# Patient Record
Sex: Female | Born: 2000 | Hispanic: Yes | Marital: Single | Smoking: Never smoker
Health system: Southern US, Community
[De-identification: ages and names within clinical notes are randomized; demographics above are authoritative.]

## PROBLEM LIST (undated history)

## (undated) DIAGNOSIS — O139 Gestational [pregnancy-induced] hypertension without significant proteinuria, unspecified trimester: Secondary | ICD-10-CM

## (undated) DIAGNOSIS — Z789 Other specified health status: Secondary | ICD-10-CM

## (undated) HISTORY — PX: TONSILLECTOMY: SUR1361

---
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
## 2021-01-14 ENCOUNTER — Other Ambulatory Visit: Payer: Self-pay

## 2021-01-14 ENCOUNTER — Encounter: Payer: Self-pay | Admitting: Women's Health

## 2021-01-16 ENCOUNTER — Other Ambulatory Visit: Payer: Self-pay

## 2021-01-16 ENCOUNTER — Encounter: Payer: Self-pay | Admitting: Obstetrics & Gynecology

## 2021-01-16 ENCOUNTER — Ambulatory Visit (INDEPENDENT_AMBULATORY_CARE_PROVIDER_SITE_OTHER): Payer: Self-pay | Admitting: Obstetrics & Gynecology

## 2021-01-16 VITALS — BP 115/79 | HR 75 | Wt 170.0 lb

## 2021-01-16 DIAGNOSIS — Z3403 Encounter for supervision of normal first pregnancy, third trimester: Secondary | ICD-10-CM

## 2021-01-16 DIAGNOSIS — Z3A28 28 weeks gestation of pregnancy: Secondary | ICD-10-CM

## 2021-01-16 NOTE — Progress Notes (Signed)
° °  LOW-RISK PREGNANCY VISIT Patient name: Ruth Wood MRN [id number]  Date of birth: 2001-01-16 Chief Complaint:   Routine Prenatal Visit  History of Present Illness:   Ruth Wood is a 20 y.o. G84P0010 female at [redacted]w[redacted]d with an Estimated Date of Delivery: 04/10/21 being seen today for ongoing management of a low-risk pregnancy.  Depression screen [facility] 2/9 12/17/2020 09/19/2019  Decreased Interest 0 1  Down, Depressed, Hopeless 0 0  PHQ - 2 Score 0 1  Altered sleeping 0 1  Tired, decreased energy 0 0  Change in appetite 0 0  Feeling bad or failure about yourself  0 0  Trouble concentrating 1 0  Moving slowly or fidgety/restless 0 2  Suicidal thoughts 0 0  PHQ-9 Score 1 4    Today she reports no complaints. Contractions: Not present. Vag. Bleeding: None.  Movement: Present. denies leaking of fluid. Review of Systems:   Pertinent items are noted in HPI Denies abnormal vaginal discharge w/ itching/odor/irritation, headaches, visual changes, shortness of breath, chest pain, abdominal pain, severe nausea/vomiting, or problems with urination or bowel movements unless otherwise stated above. Pertinent History Reviewed:  Reviewed past medical,surgical, social, obstetrical and family history.  Reviewed problem list, medications and allergies. Physical Assessment:   Vitals:   01/16/21 0902  BP: 115/79  Pulse: 75  Weight: 170 lb (77.1 kg)  Body mass index is 30.11 kg/m.        Physical Examination:   General appearance: Well appearing, and in no distress  Mental status: Alert, oriented to person, place, and time  Skin: Warm & dry  Cardiovascular: Normal heart rate noted  Respiratory: Normal respiratory effort, no distress  Abdomen: Soft, gravid, nontender  Pelvic: Cervical exam deferred         Extremities: Edema: None  Fetal Status: Fetal Heart Rate (bpm): 153 Fundal Height: 28 cm Movement: Present    Chaperone: n/a    No results found for this or any previous  visit (from the past 24 hour(s)).  Assessment & Plan:  1) Low-risk pregnancy G2P0010 at [redacted]w[redacted]d with an Estimated Date of Delivery: 04/10/21      Meds: No orders of the defined types were placed in this encounter.  Labs/procedures today: PN2  Plan:  Continue routine obstetrical care  Next visit: prefers in person    Reviewed: Preterm labor symptoms and general obstetric precautions including but not limited to vaginal bleeding, contractions, leaking of fluid and fetal movement were reviewed in detail with the patient.  All questions were answered. Has home bp cuff. Rx faxed to . Check bp weekly, let us know if >140/90.   Follow-up: Return in about 4 weeks (around 02/13/2021).  No orders of the defined types were placed in this encounter.   Lazaro Arms, MD 01/16/2021 9:41 AM

## 2021-01-17 ENCOUNTER — Other Ambulatory Visit: Payer: Self-pay

## 2021-01-21 ENCOUNTER — Other Ambulatory Visit: Payer: Self-pay

## 2021-01-22 LAB — CBC
Hematocrit: 33.8 % — ABNORMAL LOW (ref 34.0–46.6)
Hemoglobin: 11.2 g/dL (ref 11.1–15.9)
MCH: 27.8 pg (ref 26.6–33.0)
MCHC: 33.1 g/dL (ref 31.5–35.7)
MCV: 84 fL (ref 79–97)
Platelets: 198 10*3/uL (ref 150–450)
RBC: 4.03 x10E6/uL (ref 3.77–5.28)
RDW: 14.1 % (ref 11.7–15.4)
WBC: 7.5 10*3/uL (ref 3.4–10.8)

## 2021-01-22 LAB — GLUCOSE TOLERANCE, 2 HOURS W/ 1HR
Glucose, 1 hour: 137 mg/dL (ref 70–179)
Glucose, 2 hour: 109 mg/dL (ref 70–152)
Glucose, Fasting: 74 mg/dL (ref 70–91)

## 2021-01-22 LAB — RPR: RPR Ser Ql: NONREACTIVE

## 2021-01-22 LAB — HIV ANTIBODY (ROUTINE TESTING W REFLEX): HIV Screen 4th Generation wRfx: NONREACTIVE

## 2021-01-22 LAB — ANTIBODY SCREEN: Antibody Screen: NEGATIVE

## 2021-01-26 NOTE — L&D Delivery Note (Addendum)
OB/GYN Faculty Practice Delivery Note ? ?Ruth Wood is a 21 y.o. G2P1011 s/p SVD at [redacted]w[redacted]d. She was admitted for IOL d/t preE w/ SF.  ? ?ROM: 10h 88m with clear fluid ?GBS Status: Negative ?Maximum Maternal Temperature: 99.7 ? ?Labor Progress: ?Pt presented for IOL s/p Cytotec, FB, and Pitocin.  SROM with IUPC placement, later progressed to complete.  She received intrapartum magnesium sulfate and labetalol for elevated BP's.  ? ?Delivery Date/Time: 03/29/2021 @ 1428 ? ?Delivery: Called to room and patient was complete and pushing. Head delivered LOA. No nuchal cord present. Shoulder and body delivered in usual fashion. Infant with spontaneous cry, placed on mother's abdomen, dried and stimulated. Cord clamped x 2 after 1-minute delay and cut by FOB under my direct supervision. Cord blood drawn. Placenta delivered spontaneously with gentle cord traction. Fundus firm with massage and Pitocin. TXA given. Labia, perineum, vagina, and cervix were inspected. Right labial abrasion noted that was hemostatic with pressure and not repaired.  ? ?Placenta: Intact, 3 VC, sent to L&D ?Complications: None ?Lacerations: Right labial abrasion  ?EBL: 100 cc ?Analgesia: Epidural ? ?Infant: Viable female  APGARs 8/9  2680 g ? ?Transfer: Mom to  [facility] .  Baby to Couplet care / Skin to Skin. Routine Care. ? ?Kandis Fantasia, Student-MidWife ? ?GME ATTESTATION:  ?I saw and evaluated the patient. I was present and gloved for the entire delivery and management of the patient. I agree with the findings and the plan of care as documented in the student?s note. I have made changes to documentation as necessary. ? ?Evalina Field, MD ?OB Fellow, Faculty Practice ?[place] ?03/29/2021 6:02 PM ? ?

## 2021-02-13 ENCOUNTER — Encounter: Payer: Self-pay | Admitting: Women's Health

## 2021-02-26 ENCOUNTER — Encounter: Payer: Self-pay | Admitting: Women's Health

## 2021-03-27 ENCOUNTER — Inpatient Hospital Stay (HOSPITAL_COMMUNITY)
Admission: AD | Admit: 2021-03-27 | Discharge: 2021-03-31 | DRG: 807 | Disposition: A | Discharge status: Home / Self Care | Payer: Medicaid Other | Attending: Obstetrics and Gynecology | Admitting: Obstetrics and Gynecology

## 2021-03-27 ENCOUNTER — Other Ambulatory Visit: Payer: Self-pay

## 2021-03-27 ENCOUNTER — Encounter (HOSPITAL_COMMUNITY): Payer: Self-pay | Admitting: Obstetrics & Gynecology

## 2021-03-27 DIAGNOSIS — Z23 Encounter for immunization: Secondary | ICD-10-CM

## 2021-03-27 DIAGNOSIS — O1414 Severe pre-eclampsia complicating childbirth: Principal | ICD-10-CM | POA: Diagnosis present

## 2021-03-27 DIAGNOSIS — O134 Gestational [pregnancy-induced] hypertension without significant proteinuria, complicating childbirth: Secondary | ICD-10-CM | POA: Diagnosis present

## 2021-03-27 DIAGNOSIS — O0933 Supervision of pregnancy with insufficient antenatal care, third trimester: Secondary | ICD-10-CM

## 2021-03-27 DIAGNOSIS — Z789 Other specified health status: Secondary | ICD-10-CM | POA: Diagnosis present

## 2021-03-27 DIAGNOSIS — Z3400 Encounter for supervision of normal first pregnancy, unspecified trimester: Secondary | ICD-10-CM

## 2021-03-27 DIAGNOSIS — O0932 Supervision of pregnancy with insufficient antenatal care, second trimester: Secondary | ICD-10-CM

## 2021-03-27 DIAGNOSIS — Z20822 Contact with and (suspected) exposure to covid-19: Secondary | ICD-10-CM | POA: Diagnosis present

## 2021-03-27 DIAGNOSIS — O1410 Severe pre-eclampsia, unspecified trimester: Secondary | ICD-10-CM | POA: Diagnosis present

## 2021-03-27 DIAGNOSIS — Z3A38 38 weeks gestation of pregnancy: Secondary | ICD-10-CM

## 2021-03-27 DIAGNOSIS — O133 Gestational [pregnancy-induced] hypertension without significant proteinuria, third trimester: Secondary | ICD-10-CM

## 2021-03-27 DIAGNOSIS — H9190 Unspecified hearing loss, unspecified ear: Secondary | ICD-10-CM

## 2021-03-27 HISTORY — DX: Gestational (pregnancy-induced) hypertension without significant proteinuria, unspecified trimester: O13.9

## 2021-03-27 LAB — URINALYSIS, ROUTINE W REFLEX MICROSCOPIC
Bilirubin Urine: NEGATIVE
Glucose, UA: NEGATIVE mg/dL
Hgb urine dipstick: NEGATIVE
Ketones, ur: NEGATIVE mg/dL
Nitrite: NEGATIVE
Protein, ur: 100 mg/dL — AB
Specific Gravity, Urine: 1.028 (ref 1.005–1.030)
pH: 6 (ref 5.0–8.0)

## 2021-03-27 LAB — CBC
HCT: 34.3 % — ABNORMAL LOW (ref 36.0–46.0)
Hemoglobin: 11.6 g/dL — ABNORMAL LOW (ref 12.0–15.0)
MCH: 27.6 pg (ref 26.0–34.0)
MCHC: 33.8 g/dL (ref 30.0–36.0)
MCV: 81.5 fL (ref 80.0–100.0)
Platelets: 181 10*3/uL (ref 150–400)
RBC: 4.21 MIL/uL (ref 3.87–5.11)
RDW: 13.8 % (ref 11.5–15.5)
WBC: 7.2 10*3/uL (ref 4.0–10.5)
nRBC: 0 % (ref 0.0–0.2)

## 2021-03-27 LAB — COMPREHENSIVE METABOLIC PANEL
ALT: 34 U/L (ref 0–44)
AST: 28 U/L (ref 15–41)
Albumin: 2.6 g/dL — ABNORMAL LOW (ref 3.5–5.0)
Alkaline Phosphatase: 190 U/L — ABNORMAL HIGH (ref 38–126)
Anion gap: 9 (ref 5–15)
BUN: 11 mg/dL (ref 6–20)
CO2: 23 mmol/L (ref 22–32)
Calcium: 9.3 mg/dL (ref 8.9–10.3)
Chloride: 103 mmol/L (ref 98–111)
Creatinine, Ser: 0.65 mg/dL (ref 0.44–1.00)
GFR, Estimated: >60 mL/min (ref >60)
Glucose, Bld: 85 mg/dL (ref 70–99)
Potassium: 3.8 mmol/L (ref 3.5–5.1)
Sodium: 135 mmol/L (ref 135–145)
Total Bilirubin: 0.3 mg/dL (ref 0.3–1.2)
Total Protein: 6.3 g/dL — ABNORMAL LOW (ref 6.5–8.1)

## 2021-03-27 LAB — TYPE AND SCREEN
ABO/RH(D): O POS
Antibody Screen: NEGATIVE

## 2021-03-27 LAB — RESP PANEL BY RT-PCR (FLU A&B, COVID) ARPGX2
Influenza A by PCR: NEGATIVE
Influenza B by PCR: NEGATIVE
SARS Coronavirus 2 by RT PCR: NEGATIVE

## 2021-03-27 LAB — PROTEIN / CREATININE RATIO, URINE
Creatinine, Urine: 244.2 mg/dL
Protein Creatinine Ratio: 0.75 mg/mg{Cre} — ABNORMAL HIGH (ref 0.00–0.15)
Total Protein, Urine: 182 mg/dL

## 2021-03-27 LAB — GROUP B STREP BY PCR: Group B strep by PCR: NEGATIVE

## 2021-03-27 MED ORDER — ACETAMINOPHEN 500 MG PO TABS
1000.0000 mg | ORAL_TABLET | Freq: Once | ORAL | Status: AC
  Administered 2021-03-27: 1000 mg via ORAL
  Filled 2021-03-27: qty 2

## 2021-03-27 MED ORDER — HYDROXYZINE HCL 50 MG PO TABS: 50.0000 mg | ORAL_TABLET | Freq: Four times a day (QID) | ORAL | Status: DC | PRN

## 2021-03-27 MED ORDER — OXYTOCIN BOLUS FROM INFUSION
333.0000 mL | Freq: Once | INTRAVENOUS | Status: AC
  Administered 2021-03-29: 333 mL via INTRAVENOUS

## 2021-03-27 MED ORDER — LACTATED RINGERS IV SOLN
INTRAVENOUS | Status: DC
Start: 2021-03-28 — End: 2021-04-01

## 2021-03-27 MED ORDER — OXYTOCIN-SODIUM CHLORIDE 30-0.9 UT/500ML-% IV SOLN
2.5000 [IU]/h | INTRAVENOUS | Status: DC
  Administered 2021-03-29: 2.5 [IU]/h via INTRAVENOUS
  Filled 2021-03-27 (×2): qty 500

## 2021-03-27 MED ORDER — FENTANYL CITRATE (PF) 100 MCG/2ML IJ SOLN: 100.0000 ug | INTRAMUSCULAR | Status: DC | PRN

## 2021-03-27 MED ORDER — LABETALOL HCL 5 MG/ML IV SOLN: 20.0000 mg | INTRAVENOUS | Status: DC | PRN

## 2021-03-27 MED ORDER — HYDRALAZINE HCL 20 MG/ML IJ SOLN: 10.0000 mg | INTRAMUSCULAR | Status: DC | PRN

## 2021-03-27 MED ORDER — MISOPROSTOL 50MCG HALF TABLET
50.0000 ug | ORAL_TABLET | ORAL | Status: DC | PRN
  Administered 2021-03-27 – 2021-03-28 (×4): 50 ug via BUCCAL
  Filled 2021-03-27 (×4): qty 1

## 2021-03-27 MED ORDER — LIDOCAINE HCL (PF) 1 % IJ SOLN: 30.0000 mL | INTRAMUSCULAR | Status: DC | PRN

## 2021-03-27 MED ORDER — MAGNESIUM SULFATE BOLUS VIA INFUSION
4.0000 g | Freq: Once | INTRAVENOUS | Status: AC
Start: 2021-03-28 — End: 2021-03-28
  Administered 2021-03-28: 4 g via INTRAVENOUS
  Filled 2021-03-27: qty 1000

## 2021-03-27 MED ORDER — TERBUTALINE SULFATE 1 MG/ML IJ SOLN: 0.2500 mg | Freq: Once | INTRAMUSCULAR | Status: DC | PRN

## 2021-03-27 MED ORDER — LABETALOL HCL 5 MG/ML IV SOLN: 80.0000 mg | INTRAVENOUS | Status: DC | PRN

## 2021-03-27 MED ORDER — SOD CITRATE-CITRIC ACID 500-334 MG/5ML PO SOLN: 30.0000 mL | ORAL | Status: DC | PRN

## 2021-03-27 MED ORDER — ONDANSETRON HCL 4 MG/2ML IJ SOLN
4.0000 mg | Freq: Four times a day (QID) | INTRAMUSCULAR | Status: DC | PRN
  Administered 2021-03-29: 4 mg via INTRAVENOUS
  Filled 2021-03-27: qty 2

## 2021-03-27 MED ORDER — ACETAMINOPHEN 325 MG PO TABS
650.0000 mg | ORAL_TABLET | ORAL | Status: DC | PRN
  Administered 2021-03-28: 650 mg via ORAL
  Filled 2021-03-27: qty 2

## 2021-03-27 MED ORDER — LABETALOL HCL 200 MG PO TABS
200.0000 mg | ORAL_TABLET | Freq: Two times a day (BID) | ORAL | Status: DC
  Administered 2021-03-28 – 2021-03-29 (×4): 200 mg via ORAL
  Filled 2021-03-27 (×4): qty 1

## 2021-03-27 MED ORDER — MAGNESIUM SULFATE 40 GM/1000ML IV SOLN
2.0000 g/h | INTRAVENOUS | Status: DC
  Administered 2021-03-28 – 2021-03-29 (×3): 2 g/h via INTRAVENOUS
  Filled 2021-03-27 (×3): qty 1000

## 2021-03-27 MED ORDER — LACTATED RINGERS IV SOLN
500.0000 mL | INTRAVENOUS | Status: DC | PRN
  Administered 2021-03-29: 500 mL via INTRAVENOUS

## 2021-03-27 MED ORDER — FLEET ENEMA 7-19 GM/118ML RE ENEM: 1.0000 | ENEMA | RECTAL | Status: DC | PRN

## 2021-03-27 MED ORDER — LABETALOL HCL 5 MG/ML IV SOLN: 40.0000 mg | INTRAVENOUS | Status: DC | PRN

## 2021-03-27 MED ORDER — LACTATED RINGERS IV SOLN: INTRAVENOUS | Status: DC

## 2021-03-27 NOTE — MAU Note (Signed)
.  Ruth Wood is a 21 y.o. at [redacted]w[redacted]d here in MAU reporting: HBP at home. She reports getting readings of 132/92, 143/116, and 156/102. Reports HA 5/10, lower back pain 10/10, and pressure in pelvic area 5/10 that is intermittent and irregular. Denies VB or LOF. Reports good FM.  ? ?Vitals:  ? 03/27/21 2030  ?BP: (!) 145/94  ?Pulse: 91  ?Resp: 18  ?Temp: 97.7 ?F (36.5 ?C)  ?SpO2: 97%  ?   ?FHT:135 bpm ? ?

## 2021-03-27 NOTE — MAU Provider Note (Signed)
Chief Complaint  ?Patient presents with  ? Hypertension  ? ? ? Event Date/Time  ? First Provider Initiated Contact with Patient 03/27/21 2110   ?  ? ?S: Ruth Wood  is a 21 y.o. y.o. year old G49P0010 female at [redacted]w[redacted]d weeks gestation who presents to MAU with elevated blood pressures. Denies Hx of hypertension.  ?Reports headache & blurred vision that started today. Rates headache 5/10. Hasn't treated. Denies contractions, LOF, or vaginal bleeding. Reports good fetal movement.  ? ? ?O: Patient Vitals for the past 24 hrs: ? BP Temp Temp src Pulse Resp SpO2 Weight  ?03/27/21 2115 (!) 140/99 -- -- 88 -- 99 % --  ?03/27/21 2100 137/90 -- -- 85 -- 99 % --  ?03/27/21 2045 (!) 141/95 -- -- 77 -- 98 % --  ?03/27/21 2030 (!) 145/94 97.7 ?F (36.5 ?C) Oral 91 18 97 % 77.1 kg  ? ?General: NAD ?Heart: Regular rate ?Lungs: Normal rate and effort ?Abd: Soft, NT, Gravid, S=D ?Extremities: No Pedal edema ?Neuro: 2+ deep tendon reflexes, No clonus ? ?Fetal Tracing: ? ?Baseline: 130 ?Variability: moderate ?Accelerations: 15x15 ?Decelerations: none ? ?Toco: irregular ? ? ?Ultrasound ?Pt informed that the ultrasound is considered a limited OB ultrasound and is not intended to be a complete ultrasound exam.  Patient also informed that the ultrasound is not being completed with the intent of assessing for fetal or placental anomalies or any pelvic abnormalities.  Explained that the purpose of today?s ultrasound is to assess for  presentation.  Patient acknowledges the purpose of the exam and the limitations of the study.   ?Impression: cephalic ? ? ?Results for orders placed or performed during the hospital encounter of 03/27/21 (from the past 24 hour(s))  ?Urinalysis, Routine w reflex microscopic Vaginal/Rectal     Status: Abnormal  ? Collection Time: 03/27/21  9:03 PM  ?Result Value Ref Range  ? Color, Urine AMBER (A) YELLOW  ? APPearance CLOUDY (A) CLEAR  ? Specific Gravity, Urine 1.028 1.005 - 1.030  ? pH 6.0 5.0 - 8.0  ?  Glucose, UA NEGATIVE NEGATIVE mg/dL  ? Hgb urine dipstick NEGATIVE NEGATIVE  ? Bilirubin Urine NEGATIVE NEGATIVE  ? Ketones, ur NEGATIVE NEGATIVE mg/dL  ? Protein, ur 100 (A) NEGATIVE mg/dL  ? Nitrite NEGATIVE NEGATIVE  ? Leukocytes,Ua SMALL (A) NEGATIVE  ? RBC / HPF 6-10 0 - 5 RBC/hpf  ? WBC, UA 11-20 0 - 5 WBC/hpf  ? Bacteria, UA FEW (A) NONE SEEN  ? Squamous Epithelial / LPF 21-50 0 - 5  ? Mucus PRESENT   ? ? ?MDM ?New onset elevated BP. Limited prenatal care. Will admit & induce for gestational hypertension. Preeclampsia labs in process. Tylenol ordered for headache. BSUS confirmed cephalic presentation.  ? ? ?A:  ?1. Gestational hypertension, third trimester   ?2. Limited prenatal care in third trimester   ?3. [redacted] weeks gestation of pregnancy   ? ? ?P:  ?Admit to birthing suites ?GBS PCR in process ?Preeclampsia labs in process ? ?Judeth Horn, NP ?03/27/2021 ?9:29 PM ? ?

## 2021-03-27 NOTE — H&P (Addendum)
Subjective: ? Ruth Wood is a 21 y.o. G2 P0 female with EDD 04/10/21 at 83 and 0/[redacted] weeks gestation who is being admitted for [facility].  Her current obstetrical history is significant for late [facility] in second trimester, elevated pressures third trimester.  Patient reports no complaints.   Fetal Movement: normal. Denies vaginal bleeding, LOF.  ?  ? ?Objective: ?  ?Vital signs in last 24 hours: ?Temp:  [97.7 ?F (36.5 ?C)-98.1 ?F (36.7 ?C)] 98.1 ?F (36.7 ?C) (03/02 2242) ?Pulse Rate:  [72-93] 93 (03/02 2327) ?Resp:  [16-18] 16 (03/02 2242) ?BP: (126-160)/(90-110) 126/110 (03/02 2327) ?SpO2:  [97 %-100 %] 100 % (03/02 2220) ?Weight:  [77.1 kg] 77.1 kg (03/02 2030) ? ? ?General:   alert, cooperative, and appears stated age  ?Skin:   normal  ?HEENT:  PERRLA  ?Lungs:    Normal RR and rhythm, respirations unlabored  ?Heart:   regular rate and rhythm, S1, S2 normal, no murmur, click, rub or gallop  ?Breasts:   normal without suspicious masses, skin or nipple changes or axillary nodes  ?Abdomen:  soft, non-tender; bowel sounds normal; no masses,  no organomegaly  ?Pelvis:  Exam deferred.  ?FHT:  130 BPM  ?Uterine Size: size equals dates  ?Presentations: cephalic  ?Cervix:   ? Dilation: FT  ? Effacement: Long  ? Station:  Floating  ? Consistency: medium  ? Position: middle  ? ?Lab Review ? O, Rh+ ? AFP:NML ? One hour GTT: Normal  ? ? [facility]  RESULTS  ?Language Spanish Pap <21  ?Initiated care at 23wks GC/CT Initial:    -/-        36wks:  pending  ?Dating by 23wk U/S    ?Support person [place] NT/ITtoo late   DPO:EUMPNTIR      Panorama:declined  ?BP cuff Given from office Carrier Screen declined  ?  Ashley/Hgb Elec neg  ?Rhogam n/a    ?TDaP vaccine  Blood Type --/--/O POS (03/02 2144)  ?Flu vaccine  Antibody NEG (03/02 2144)  ?Covid vaccine  HBsAg Negative (11/22 1216)  ?  RPR Non Reactive (12/27 0816)  ?Anatomy US Isolated LVEICF Rubella  3.11 (11/22 1216)  ?Feeding Plan Breast and bottle HIV Non  Reactive (12/27 0816)  ?Contraception Depo Hep C neg  ?Circumcision     ?Pediatrician  A1C/GTT Early:      26-28wks: normal  ?Prenatal Classes     ?  GBS     [ ]  PCN allergy  ?BTL Consent n/a    ?VBAC Consent n/a PHQ9 & GAD7  [ ] New OB  [ ] 28wks   [ ] 36wks  ?Waterbirth [ ] Class [ ]  36wkCNM visit/consent    ? ?  ?Assessment/Plan: ? 38 and 0/[redacted] weeks gestation. ?Not in labor. mIOL cytotec followed by balloon placement when cx allows ?Obstetrical history significant for GHTN.  ?  ? Risks, benefits, alternatives and possible complications have been discussed in detail with the patient.  Pre-admission, admission, and post admission procedures and expectations were discussed in detail.  All questions answered, all appropriate consents will be signed at the Hospital. Admission is planned for today.  ?Anticipate vaginal delivery. ? ?Greggory Keen, SNM ?03/27/2021, 2341 ? ?I personally saw and evaluated the patient, performing the key elements of the service. I developed and verified the management plan that is described in the resident's/student's note, and I agree with the content with my edits above. VSS, HRR&R, Resp unlabored, Legs neg.  ?Nigel Berthold, CNM ?03/28/2021 ?7:42  AM ?  ? ?

## 2021-03-28 ENCOUNTER — Encounter (HOSPITAL_COMMUNITY): Payer: Self-pay | Admitting: Obstetrics & Gynecology

## 2021-03-28 LAB — CBC
HCT: 34.4 % — ABNORMAL LOW (ref 36.0–46.0)
Hemoglobin: 11.6 g/dL — ABNORMAL LOW (ref 12.0–15.0)
MCH: 27.2 pg (ref 26.0–34.0)
MCHC: 33.7 g/dL (ref 30.0–36.0)
MCV: 80.6 fL (ref 80.0–100.0)
Platelets: 171 10*3/uL (ref 150–400)
RBC: 4.27 MIL/uL (ref 3.87–5.11)
RDW: 13.9 % (ref 11.5–15.5)
WBC: 10.7 10*3/uL — ABNORMAL HIGH (ref 4.0–10.5)
nRBC: 0 % (ref 0.0–0.2)

## 2021-03-28 LAB — RPR: RPR Ser Ql: NONREACTIVE

## 2021-03-28 LAB — MAGNESIUM: Magnesium: 5.5 mg/dL — ABNORMAL HIGH (ref 1.7–2.4)

## 2021-03-28 MED ORDER — MISOPROSTOL 25 MCG QUARTER TABLET
25.0000 ug | ORAL_TABLET | ORAL | Status: DC
  Administered 2021-03-28: 25 ug via VAGINAL
  Filled 2021-03-28: qty 1

## 2021-03-28 MED ORDER — LACTATED RINGERS IV SOLN: 500.0000 mL | Freq: Once | INTRAVENOUS | Status: DC

## 2021-03-28 MED ORDER — OXYTOCIN-SODIUM CHLORIDE 30-0.9 UT/500ML-% IV SOLN
1.0000 m[IU]/min | INTRAVENOUS | Status: DC
  Administered 2021-03-28: 2 m[IU]/min via INTRAVENOUS

## 2021-03-28 MED ORDER — PHENYLEPHRINE 40 MCG/ML (10ML) SYRINGE FOR IV PUSH (FOR BLOOD PRESSURE SUPPORT): 80.0000 ug | PREFILLED_SYRINGE | INTRAVENOUS | Status: DC | PRN

## 2021-03-28 MED ORDER — DIPHENHYDRAMINE HCL 50 MG/ML IJ SOLN: 12.5000 mg | INTRAMUSCULAR | Status: DC | PRN

## 2021-03-28 MED ORDER — FENTANYL-BUPIVACAINE-NACL 0.5-0.125-0.9 MG/250ML-% EP SOLN
12.0000 mL/h | EPIDURAL | Status: DC | PRN
  Administered 2021-03-29: 12 mL/h via EPIDURAL
  Filled 2021-03-28: qty 250

## 2021-03-28 MED ORDER — EPHEDRINE 5 MG/ML INJ: 10.0000 mg | INTRAVENOUS | Status: DC | PRN

## 2021-03-28 MED ORDER — TERBUTALINE SULFATE 1 MG/ML IJ SOLN: 0.2500 mg | Freq: Once | INTRAMUSCULAR | Status: DC | PRN

## 2021-03-28 MED ORDER — ZOLPIDEM TARTRATE 5 MG PO TABS
5.0000 mg | ORAL_TABLET | Freq: Once | ORAL | Status: AC
  Administered 2021-03-28: 5 mg via ORAL
  Filled 2021-03-28: qty 1

## 2021-03-28 NOTE — Progress Notes (Signed)
Ruth Wood is a 21 y.o. G2P0010 at [redacted]w[redacted]d by ultrasound admitted for induction of labor due to preE w/ SF (elevated BPs). ? ?Subjective: ?Ruth Wood is coping well with CTXs using breathing techniques. Does not report deceased fetal movement, vaginal bleeding, LOF. Family at bs for support.  ? ?Objective: ?BP (!) 142/102   Pulse 95   Temp (!) 97.3 ?F (36.3 ?C) (Axillary)   Resp 18   Ht 5\' 1"  (1.549 m)   Wt 77.1 kg   SpO2 100%   BMI 32.12 kg/m?  ?I/O last 3 completed shifts: ?In: 2646.5 [P.O.:795; I.V.:1851.5] ?Out: 2300 [Urine:2300] ?Total I/O ?In: 708.2 [P.O.:300; I.V.:408.2] ?Out: 200 [Urine:200] ? ?FHT:  FHR: 130 bpm, variability: moderate,  accelerations:  Present,  decelerations:  Absent ?UC:   irregular, every 3-8 minutes ?SVE:   Dilation: 6 ?Effacement (%): 70 ?Station: -3 ?Exam by:: [email or web address], snm ? ?Labs: ?Lab Results  ?Component Value Date  ? WBC 7.2 03/27/2021  ? HGB 11.6 (L) 03/27/2021  ? HCT 34.3 (L) 03/27/2021  ? MCV 81.5 03/27/2021  ? PLT 181 03/27/2021  ? ? ?Assessment / Plan: ?mIOL at 38.1, progressing normally ? ?Labor:  following spontaneous removal of FB, plan to initiate pitocin per order as tolerated ?Preeclampsia:  on magnesium sulfate and labs stable ?Fetal Wellbeing:  Category I ?Pain Control:  Labor support without medications and epidural PRN ?I/D:  n/a ?Anticipated MOD:  NSVD ? ?05/27/2021, SNM ?03/28/2021, 9:06 PM ? ? ?

## 2021-03-28 NOTE — Progress Notes (Signed)
Labor Progress Note ?Ruth Wood is a 21 y.o. G2P0010 at [redacted]w[redacted]d presented for IOL due to pre-e with SF.  ? ?Checked per RN, FB still in place. Patient doing well, trying to rest.  ? ?O:  ?BP 135/86   Pulse 86   Temp 98 ?F (36.7 ?C) (Oral)   Resp 16   Wt 77.1 kg   SpO2 100%   BMI 30.11 kg/m?  ?EFM: 125/mod/15x15/none ? ?CVE: Dilation: 2 ?Effacement (%): Thick ?Station: -3 ?Presentation: Vertex ?Exam by:: lee ? ? ?A&P: 20 y.o. G2P0010 [redacted]w[redacted]d  ?#Labor: Slowly progressing, s/p 4 buccal cytotecs and given vaginal cytotec at 1700. FB still in place. Continue to monitor.  ?#Pain: PRN  ?#FWB: Cat I  ?#GBS negative ? ?#Pre-e with SF: BP has been mild range with po labetalol. No symptoms. Cont Mag. Will monitor.  ? ?Allayne Stack, DO ?7:39 PM  ?

## 2021-03-28 NOTE — Progress Notes (Signed)
Patient Vitals for the past 4 hrs: ? BP Temp Temp src Pulse Resp  ?03/28/21 0700 126/70 -- -- 96 16  ?03/28/21 0617 (!) 132/92 -- -- 79 17  ?03/28/21 0514 123/70 -- -- 96 16  ?03/28/21 0417 136/87 97.7 ?F (36.5 ?C) Axillary 94 16  ? ?FHR Cat 1.  2nd cytotec at 0415, no real change in cx.  Minimal UI.  Will assess for readiness for a balloon with next check.  ?

## 2021-03-28 NOTE — Progress Notes (Signed)
Labor Progress Note ?Ruth Wood is a 21 y.o. G2P0010 at [redacted]w[redacted]d presented for  IOL due to pre-e with SF.  ? ?S: Doing well, just woke up from a nap.  ? ?O:  ?BP (!) 124/103   Pulse (!) 103   Temp 97.7 ?F (36.5 ?C) (Axillary)   Resp 16   Wt 77.1 kg   SpO2 100%   BMI 30.11 kg/m?  ?EFM: 135/mod/15x15/none ? ?CVE: Dilation: Fingertip ?Effacement (%): Thick ?Station: -3 ?Presentation: Vertex ?Exam by:: lee ? ? ?A&P: 21 y.o. G2P0010 [redacted]w[redacted]d  ?#Labor: Progressing well. After verbal consent, placed a FB. Patient tolerated well. Will give additional buccal cytotec, plan for next to be vaginal if still needed.  ?#Pain: PRN  ?#FWB: Cat I  ?#GBS negative ? ?#Pre-e with SF: Currently mild range, one diastolic severe but back to mild on recheck. Asymptomatic. Pre-e labs WNL (elevated P/Cr ratio). Cont mag.  ? ?Patriciaann Clan, DO ?12:43 PM  ?

## 2021-03-29 ENCOUNTER — Inpatient Hospital Stay (HOSPITAL_COMMUNITY): Payer: Medicaid Other | Admitting: Anesthesiology

## 2021-03-29 ENCOUNTER — Encounter (HOSPITAL_COMMUNITY): Payer: Self-pay | Admitting: Obstetrics & Gynecology

## 2021-03-29 DIAGNOSIS — O1414 Severe pre-eclampsia complicating childbirth: Secondary | ICD-10-CM

## 2021-03-29 DIAGNOSIS — Z3A38 38 weeks gestation of pregnancy: Secondary | ICD-10-CM

## 2021-03-29 LAB — CBC
HCT: 34.8 % — ABNORMAL LOW (ref 36.0–46.0)
Hemoglobin: 11.7 g/dL — ABNORMAL LOW (ref 12.0–15.0)
MCH: 27.2 pg (ref 26.0–34.0)
MCHC: 33.6 g/dL (ref 30.0–36.0)
MCV: 80.9 fL (ref 80.0–100.0)
Platelets: 168 10*3/uL (ref 150–400)
RBC: 4.3 MIL/uL (ref 3.87–5.11)
RDW: 14 % (ref 11.5–15.5)
WBC: 16.3 10*3/uL — ABNORMAL HIGH (ref 4.0–10.5)
nRBC: 0 % (ref 0.0–0.2)

## 2021-03-29 LAB — COMPREHENSIVE METABOLIC PANEL
ALT: 44 U/L (ref 0–44)
AST: 40 U/L (ref 15–41)
Albumin: 2.3 g/dL — ABNORMAL LOW (ref 3.5–5.0)
Alkaline Phosphatase: 195 U/L — ABNORMAL HIGH (ref 38–126)
Anion gap: 11 (ref 5–15)
BUN: <5 mg/dL — ABNORMAL LOW (ref 6–20)
CO2: 22 mmol/L (ref 22–32)
Calcium: 6.7 mg/dL — ABNORMAL LOW (ref 8.9–10.3)
Chloride: 98 mmol/L (ref 98–111)
Creatinine, Ser: 0.65 mg/dL (ref 0.44–1.00)
GFR, Estimated: >60 mL/min (ref >60)
Glucose, Bld: 105 mg/dL — ABNORMAL HIGH (ref 70–99)
Potassium: 3.7 mmol/L (ref 3.5–5.1)
Sodium: 131 mmol/L — ABNORMAL LOW (ref 135–145)
Total Bilirubin: 0.5 mg/dL (ref 0.3–1.2)
Total Protein: 6.1 g/dL — ABNORMAL LOW (ref 6.5–8.1)

## 2021-03-29 LAB — MAGNESIUM: Magnesium: 5.9 mg/dL — ABNORMAL HIGH (ref 1.7–2.4)

## 2021-03-29 MED ORDER — ONDANSETRON HCL 4 MG/2ML IJ SOLN: 4.0000 mg | INTRAMUSCULAR | Status: DC | PRN

## 2021-03-29 MED ORDER — TETANUS-DIPHTH-ACELL PERTUSSIS 5-2.5-18.5 LF-MCG/0.5 IM SUSY
0.5000 mL | PREFILLED_SYRINGE | Freq: Once | INTRAMUSCULAR | Status: AC
  Administered 2021-03-30: 0.5 mL via INTRAMUSCULAR
  Filled 2021-03-29: qty 0.5

## 2021-03-29 MED ORDER — COCONUT OIL OIL
1.0000 "application " | TOPICAL_OIL | Status: DC | PRN
  Administered 2021-03-31: 1 via TOPICAL

## 2021-03-29 MED ORDER — DIPHENHYDRAMINE HCL 25 MG PO CAPS: 25.0000 mg | ORAL_CAPSULE | Freq: Four times a day (QID) | ORAL | Status: DC | PRN

## 2021-03-29 MED ORDER — IBUPROFEN 600 MG PO TABS
600.0000 mg | ORAL_TABLET | Freq: Four times a day (QID) | ORAL | Status: DC
  Administered 2021-03-29 – 2021-03-31 (×7): 600 mg via ORAL
  Filled 2021-03-29 (×8): qty 1

## 2021-03-29 MED ORDER — LIDOCAINE HCL (PF) 1 % IJ SOLN
INTRAMUSCULAR | Status: DC | PRN
  Administered 2021-03-29: 8 mL via EPIDURAL

## 2021-03-29 MED ORDER — WITCH HAZEL-GLYCERIN EX PADS
1.0000 | MEDICATED_PAD | CUTANEOUS | Status: DC | PRN
Start: 2021-03-29 — End: 2021-04-01

## 2021-03-29 MED ORDER — DIBUCAINE (PERIANAL) 1 % EX OINT: 1.0000 "application " | TOPICAL_OINTMENT | CUTANEOUS | Status: DC | PRN

## 2021-03-29 MED ORDER — ONDANSETRON HCL 4 MG PO TABS: 4.0000 mg | ORAL_TABLET | ORAL | Status: DC | PRN

## 2021-03-29 MED ORDER — PRENATAL MULTIVITAMIN CH
1.0000 | ORAL_TABLET | Freq: Every day | ORAL | Status: DC
  Administered 2021-03-30 – 2021-03-31 (×2): 1 via ORAL
  Filled 2021-03-29 (×2): qty 1

## 2021-03-29 MED ORDER — ACETAMINOPHEN 325 MG PO TABS
650.0000 mg | ORAL_TABLET | ORAL | Status: DC | PRN
  Administered 2021-03-30: 650 mg via ORAL
  Filled 2021-03-29: qty 2

## 2021-03-29 MED ORDER — SIMETHICONE 80 MG PO CHEW
80.0000 mg | CHEWABLE_TABLET | ORAL | Status: DC | PRN
  Filled 2021-03-29: qty 1

## 2021-03-29 MED ORDER — MEDROXYPROGESTERONE ACETATE 150 MG/ML IM SUSP
150.0000 mg | INTRAMUSCULAR | Status: AC | PRN
  Administered 2021-03-31: 150 mg via INTRAMUSCULAR
  Filled 2021-03-29: qty 1

## 2021-03-29 MED ORDER — MAGNESIUM SULFATE 40 GM/1000ML IV SOLN
2.0000 g/h | INTRAVENOUS | Status: AC
  Filled 2021-03-29: qty 1000

## 2021-03-29 MED ORDER — BENZOCAINE-MENTHOL 20-0.5 % EX AERO
1.0000 "application " | INHALATION_SPRAY | CUTANEOUS | Status: DC | PRN
  Filled 2021-03-29: qty 56

## 2021-03-29 MED ORDER — SENNOSIDES-DOCUSATE SODIUM 8.6-50 MG PO TABS
2.0000 | ORAL_TABLET | Freq: Every day | ORAL | Status: DC
  Administered 2021-03-30 – 2021-03-31 (×2): 2 via ORAL
  Filled 2021-03-29 (×2): qty 2

## 2021-03-29 MED ORDER — TRANEXAMIC ACID-NACL 1000-0.7 MG/100ML-% IV SOLN
1000.0000 mg | Freq: Once | INTRAVENOUS | Status: AC
  Administered 2021-03-29: 1000 mg via INTRAVENOUS

## 2021-03-29 NOTE — Anesthesia Preprocedure Evaluation (Signed)
Anesthesia Evaluation  ?Patient identified by MRN, date of birth, ID band ?Patient awake ? ? ? ?Reviewed: ?Allergy & Precautions, NPO status , Patient's Chart, lab work & pertinent test results ? ?Airway ?Mallampati: II ? ?TM Distance: >3 FB ?Neck ROM: Full ? ? ? Dental ?no notable dental hx. ? ?  ?Pulmonary ?neg pulmonary ROS,  ?  ?Pulmonary exam normal ?breath sounds clear to auscultation ? ? ? ? ? ? Cardiovascular ?hypertension (preeclampsia on Mag), Pt. on medications ?Normal cardiovascular exam ?Rhythm:Regular Rate:Normal ? ? ?  ?Neuro/Psych ?negative neurological ROS ? negative psych ROS  ? GI/Hepatic ?negative GI ROS, Neg liver ROS,   ?Endo/Other  ?negative endocrine ROS ? Renal/GU ?negative Renal ROS  ?negative genitourinary ?  ?Musculoskeletal ?negative musculoskeletal ROS ?(+)  ? Abdominal ?  ?Peds ?negative pediatric ROS ?(+)  Hematology ? ?(+) Blood dyscrasia, anemia ,   ?Anesthesia Other Findings ? ? Reproductive/Obstetrics ?(+) Pregnancy ? ?  ? ? ? ? ? ? ? ? ? ? ? ? ? ?  ?  ? ? ? ? ? ? ? ? ?Anesthesia Physical ?Anesthesia Plan ? ?ASA: 3 ? ?Anesthesia Plan: Epidural  ? ?Post-op Pain Management:   ? ?Induction:  ? ?PONV Risk Score and Plan: 2 and Treatment may vary due to age or medical condition ? ?Airway Management Planned: Natural Airway ? ?Additional Equipment: None ? ?Intra-op Plan:  ? ?Post-operative Plan:  ? ?Informed Consent: I have reviewed the patients History and Physical, chart, labs and discussed the procedure including the risks, benefits and alternatives for the proposed anesthesia with the patient or authorized representative who has indicated his/her understanding and acceptance.  ? ? ? ? ? ?Plan Discussed with: Anesthesiologist ? ?Anesthesia Plan Comments:   ? ? ? ? ? ? ?Anesthesia Quick Evaluation ? ?

## 2021-03-29 NOTE — Lactation Note (Signed)
This note was copied from a baby's chart. ?Lactation Consultation Note ? ?Patient Name: Ruth Wood ?Today's Date: 03/29/2021 ?Reason for consult: L&D Initial assessment ?Age:20 hours ?Consult was done in Spanish: ?L&D consult with >60 minutes old infant and P1 mother. Congratulated family on newborn. Assisted with skin to skin prone on mother's chest. Assisted with hand expression and spoon/fingerfed ~50mL. Swaddled baby per mother's request. LC noted tongue anchored to floor of mouth. Observed  difficult to elicit sucking reflex.  ? ?Discussed STS as ideal transition for infants after birth. Talked about primal reflexes. Explained LC services availability during postpartum stay. Thanked family for their time.   ? ? ?Maternal Data ?Has patient been taught Hand Expression?: Yes ?Does the patient have breastfeeding experience prior to this delivery?: No ? ?Feeding ?Mother's Current Feeding Choice: Breast Milk and Formula ? ?LATCH Score ?Latch: Too sleepy or reluctant, no latch achieved, no sucking elicited. ? ?Audible Swallowing: None ? ?Type of Nipple: Everted at rest and after stimulation ? ?Comfort (Breast/Nipple): Soft / non-tender ? ?Hold (Positioning): Assistance needed to correctly position infant at breast and maintain latch. ? ?LATCH Score: 5 ? ?Interventions ?Interventions: Skin to skin;Assisted with latch;Hand express;Expressed milk;Education ? ?Discharge ?WIC Program: Yes ? ?Consult Status ?Consult Status: Follow-up from L&D ?Date: 03/29/21 ?Follow-up type: In-patient ? ? ? ?Jamisha Hoeschen A Higuera Ancidey ?03/29/2021, 3:42 PM ? ? ? ?

## 2021-03-29 NOTE — Discharge Summary (Signed)
? ?  Postpartum Discharge Summary ?   ?Patient Name: Ruth Wood ?DOB: [birth year] ?MRN: [id number] ?[facility]: [facility] visits (Last visit at 23/0) ? ?Date of admission: 03/27/2021 ?Delivery date:03/29/2021  ?Delivering provider: Genia Del  ?Date of discharge: 03/31/2021 ? ?Admitting diagnosis: Pregnancy at 38/0. Severe pre-eclampsia (mild range BPs with headache).   ?Additional problems: Scant prenatal care. Difficulty hearing. Language barrier.    ?Discharge diagnosis: Term Pregnancy Delivered. Difficulty hearing                                              ?Post partum procedures: None ?Augmentation: Pitocin, Cytotec, and IP Foley ?Complications: None ? ?Hospital course: Patient had an uncomplicated labor course and progressed to complete after foley balloon placement, Cytotec, Pitocin, and SROM. She had an uncomplicated vaginal delivery.  ?Membrane Rupture Time/Date: 3:55 AM ,03/29/2021   ?Delivery Method:Vaginal, Spontaneous  ?Episiotomy: None  ?Lacerations:  Labial  ?Details of delivery can be found in separate delivery note.  She received magnesium for 24 hours postpartum due severe pre-eclampsia. She was started on Procardia and Lasix for blood pressure control with good result.  She is eating, drinking, ambulating, and voiding without issue. Patient is discharged home 03/31/21 after being seen by SW for scant prenatal care. She received depo provera prior to discharge; pt has no insurance.  ? ?Hearing: patient states she's had difficulty with hearing since the beginning of pregnancy. I told her to mention this at her postpartum visit and we can refer her for evaluation. ? ?Interpreter used ? ?Newborn Data: ?Birth date:03/29/2021  ?Birth time:2:28 PM  ?Gender:Female  ?Living status:Living  ?Apgars:8 ,9  ?Weight:2680 g  ? ?Magnesium Sulfate received: Yes: Seizure prophylaxis ?BMZ received: No ?Rhophylac: N/A ?MMR: N/A ?T-DaP: Offered postpartum  ?Flu: No ?Transfusion: No  ? ?Physical exam   ?Vitals:  ? 03/30/21 2325 03/31/21 6606 03/31/21 0350 03/31/21 0843  ?BP: 128/78 122/79  132/74  ?Pulse: (!) 109 88  (!) 126  ?Resp: _0 ?Temp: 99.4 ?F (37.4 ?C) 98.1 ?F (36.7 ?C)  97.8 ?F (36.6 ?C)  ?TempSrc: Oral Oral  Oral  ?SpO2: 99% 100% 98% 99%  ?Weight:      ?Height:      ? ?General: alert ?Lochia: appropriate ?Uterine Fundus: nttp, firm fundus below the umbilicus ?Incision: N/A ?DVT Evaluation: No evidence of DVT seen on physical exam. ? ?Labs: ?Lab Results  ?Component Value Date  ? WBC 16.3 (H) 03/29/2021  ? HGB 11.7 (L) 03/29/2021  ? HCT 34.8 (L) 03/29/2021  ? MCV 80.9 03/29/2021  ? PLT 168 03/29/2021  ? ?CMP Latest Ref Rng & Units 03/29/2021  ?Glucose 70 - 99 mg/dL 105(H)  ?BUN 6 - 20 mg/dL <5(L)  ?Creatinine 0.44 - 1.00 mg/dL 0.65  ?Sodium 135 - 145 mmol/L 131(L)  ?Potassium 3.5 - 5.1 mmol/L 3.7  ?Chloride 98 - 111 mmol/L 98  ?CO2 22 - 32 mmol/L 22  ?Calcium 8.9 - 10.3 mg/dL 6.7(L)  ?Total Protein 6.5 - 8.1 g/dL 6.1(L)  ?Total Bilirubin 0.3 - 1.2 mg/dL 0.5  ?Alkaline Phos 38 - 126 U/L 195(H)  ?AST 15 - 41 U/L 40  ?ALT 0 - 44 U/L 44  ? ?Edinburgh Score: ?Edinburgh Postnatal Depression Scale Screening Tool 03/30/2021  ?I have been able to laugh and see the funny side of things. 0  ?  I have looked forward with enjoyment to things. 0  ?I have blamed myself unnecessarily when things went wrong. 2  ?I have been anxious or worried for no good reason. 1  ?I have felt scared or panicky for no good reason. 2  ?Things have been getting on top of me. 3  ?I have been so unhappy that I have had difficulty sleeping. 3  ?I have felt sad or miserable. 1  ?I have been so unhappy that I have been crying. 0  ?The thought of harming myself has occurred to me. 0  ?Edinburgh Postnatal Depression Scale Total 12  ? ? ? ?After visit meds:  ?Allergies as of 03/31/2021   ?No Known Allergies ?  ? ?  ?Medication List  ?  ? ?STOP taking these medications   ? ?ondansetron 4 MG disintegrating tablet ?Commonly known as: ZOFRAN-ODT ?   ? ?  ? ?TAKE these medications   ? ?acetaminophen 325 MG tablet ?Commonly known as: Tylenol ?Take 2 tablets (650 mg total) by mouth every 4 (four) hours as needed (for pain scale < 4). ?  ?ibuprofen 600 MG tablet ?Commonly known as: ADVIL ?Take 1 tablet (600 mg total) by mouth every 6 (six) hours as needed. ?  ?multivitamin-prenatal 27-0.8 MG Tabs tablet ?Take 1 tablet by mouth daily at 12 noon. ?  ?NIFEdipine 30 MG 24 hr tablet ?Commonly known as: ADALAT CC ?Tome 1 tableta (30 mg en total) por v?a oral diariamente. ?(Take 1 tablet (30 mg total) by mouth daily.) ?Start taking on: April 01, 2021 ?  ? ?  ? ? ? ?Discharge home in stable condition ?Infant Feeding: Breast ?Infant Disposition: home with mother ?Discharge instruction: per After Visit Summary and Postpartum booklet. ?Activity: Advance as tolerated. Pelvic rest for 6 weeks.  ?Diet: routine diet ?Future Appointments: ?Future Appointments  ?Date Time Provider [place]  ?04/07/2021 10:10 AM [facility] NURSE [facility]  ?05/06/2021 11:50 AM Roma Schanz, CNM [facility]  ? ?Follow up Visit: ? Follow-up Information   ? ? [facility]. Go in 7 day(s).   ?Specialty: Obstetrics and Gynecology ?Why: blood pressure check ?Contact information: ?[address] [place] ?[phone] ? ?  ?  ? ? [facility]. Go in 38 day(s).   ?Specialty: Obstetrics and Gynecology ?Why: routine after baby follow up ?Contact information: ?[address] [place] ?[phone] ? ?  ?  ? ?  ?  ? ?  ? ?Message sent to [facility] by Dr. Gwenlyn Perking on 03/29/21. ? ?Please schedule this patient for a In person postpartum visit in 4-6 weeks with the following provider: Any provider. ?Additional Postpartum F/U: BP check 1 week  ?High risk pregnancy complicated by:  Severe pre-eclampsia ?Delivery mode:  Vaginal, Spontaneous  ?Anticipated Birth Control:  Depo given prior to  discharge ? ? ?03/31/2021 ?Aletha Halim, MD ? ? ? ?

## 2021-03-29 NOTE — Anesthesia Procedure Notes (Signed)
Epidural ?Patient location during procedure: OB ?Start time: 03/29/2021 4:50 AM ?End time: 03/29/2021 4:55 AM ? ?Staffing ?Anesthesiologist: Merlinda Frederick, MD ?Performed: anesthesiologist  ? ?Preanesthetic Checklist ?Completed: patient identified, IV checked, site marked, risks and benefits discussed, monitors and equipment checked, pre-op evaluation and timeout performed ? ?Epidural ?Patient position: sitting ?Prep: DuraPrep ?Patient monitoring: heart rate, cardiac monitor, continuous pulse ox and blood pressure ?Approach: midline ?Location: L2-L3 ?Injection technique: LOR saline ? ?Needle:  ?Needle type: Tuohy  ?Needle gauge: 17 G ?Needle length: 9 cm ?Needle insertion depth: 6 cm ?Catheter type: closed end flexible ?Catheter size: 20 Guage ?Catheter at skin depth: 11 cm ?Test dose: negative and Other ? ?Assessment ?Events: blood not aspirated, injection not painful, no injection resistance and negative IV test ? ?Additional Notes ?Informed consent obtained prior to proceeding including risk of failure, 1% risk of PDPH, risk of minor discomfort and bruising.  Discussed rare but serious complications including epidural abscess, permanent nerve injury, epidural hematoma.  Discussed alternatives to epidural analgesia and patient desires to proceed.  Timeout performed pre-procedure verifying patient name, procedure, and platelet count.  Patient tolerated procedure well. ? ? ? ? ?

## 2021-03-29 NOTE — Progress Notes (Signed)
Labor Progress Note ?Ruth Wood is a 21 y.o. G2P0010 at [redacted]w[redacted]d who presented for IOL due to severe pre-eclampsia.  ? ?S: Doing well. No concerns. Feeling more pressure and cramping.  ? ?O:  ?BP 122/85   Pulse (!) 104   Temp 98.3 ?F (36.8 ?C) (Oral)   Resp 18   Ht 5\' 1"  (1.549 m)   Wt 77.1 kg   SpO2 100%   BMI 32.12 kg/m?  ? ?EFM: Baseline 120 bpm, moderate variability, no accels, early/late decels  ?Toco: Every 1-2 minutes  ? ?CVE: Dilation: 10 ?Dilation Complete Date: 03/29/21 ?Dilation Complete Time: 1355 ?Effacement (%): 80 ?Station: 0 ?Presentation: Vertex ?Exam by:: Dr Gwenlyn Perking ? ? ?A&P: 21 y.o. G2P0010 [redacted]w[redacted]d  ? ?#Labor: Patient now complete and 0 station. Late decels improved with position change. Will continue Pitocin at current rate. Will set up for delivery and plan to start pushing afterwards if patient is feeling pressure/urge to push. Anticipate SVD.  ?#Pain: Epidural  ?#FWB: Cat 2 due to recent late decels, now resolved with position change. Will continue to monitor closely.  ?#GBS negative ? ?#Severe pre-eclampsia: On Mag. BP normal range currently. 12 PM labs ordered - Mg 5.9, normal LFTs and platelets. Will continue to monitor closely.  ? ?Genia Del, MD ?2:04 PM ? ?

## 2021-03-30 LAB — GLUCOSE, CAPILLARY: Glucose-Capillary: 78 mg/dL (ref 70–99)

## 2021-03-30 MED ORDER — FUROSEMIDE 40 MG PO TABS
20.0000 mg | ORAL_TABLET | Freq: Every day | ORAL | Status: DC
  Administered 2021-03-30 – 2021-03-31 (×2): 20 mg via ORAL
  Filled 2021-03-30 (×2): qty 1

## 2021-03-30 MED ORDER — NIFEDIPINE ER OSMOTIC RELEASE 30 MG PO TB24
30.0000 mg | ORAL_TABLET | Freq: Every day | ORAL | Status: DC
  Administered 2021-03-30 – 2021-03-31 (×2): 30 mg via ORAL
  Filled 2021-03-30 (×2): qty 1

## 2021-03-30 NOTE — Lactation Note (Signed)
This note was copied from a baby's chart. ?Lactation Consultation Note ? ?Patient Name: Ruth Wood ?Today's Date: 03/30/2021 ?Reason for consult: Initial assessment;Mother's request;Primapara;1st time breastfeeding;Early term 37-38.6wks;Infant < 6lbs;Breastfeeding assistance;Other (Comment) (PIH (labetalol )) ?Age:20 hours ? ?Marta and Raquel served as Therapist, music during the visit.  ? ?LC assisted latching infant in football position with signs of milk transfer.  ?Mom feeding plan is breast and bottle ( supplementing with EBM first followed by formula).  ? ?LPTI guidelines reviewed to reduce calorie loss given birthweight is less than 6 lbs including keeping total feeding under 30 min.  ?  ?Plan 1. To feed based on cues 8-12x 24hr period. Mom to offer breasts and look for signs of milk transfer.  ?2. Mom to supplement with slow flow nipple and pace bottle feeding with EBM first followed by formula 10-20 ml per feeding. Mom aware to offer more if infant not latching.  ?3. DEBP q 3hrs for  ?4. I and O sheet reviewed.  ?All questions answered at the end of the visit.  ?RN and Provider present during visit and aware of feeding plan above.  ?All questions answered at the end of the visit.  ?Maternal Data ?Has patient been taught Hand Expression?: Yes ?Does the patient have breastfeeding experience prior to this delivery?: No ? ?Feeding ?Mother's Current Feeding Choice: Breast Milk and Formula ?Nipple Type: Slow - flow ? ?LATCH Score ?Latch: Repeated attempts needed to sustain latch, nipple held in mouth throughout feeding, stimulation needed to elicit sucking reflex. ? ?Audible Swallowing: Spontaneous and intermittent ? ?Type of Nipple: Everted at rest and after stimulation ? ?Comfort (Breast/Nipple): Soft / non-tender ? ?Hold (Positioning): Assistance needed to correctly position infant at breast and maintain latch. ? ?LATCH Score: 8 ? ? ?Lactation Tools Discussed/Used ?Tools:  Pump;Flanges ?Breast pump type: Double-Electric Breast Pump (RN accessed flange size. Mom states comfortable fit.) ?Pump Education: Setup, frequency, and cleaning;Milk Storage ?Reason for Pumping: increase stimulation ?Pumping frequency: every 3 hrs for 15 min ? ?Interventions ?Interventions: Breast feeding basics reviewed;Assisted with latch;Skin to skin;Breast massage;Hand express;Breast compression;Adjust position;Support pillows;Position options;Expressed milk;DEBP;Education;Pace feeding;LC Psychologist, educational;Infant Driven Feeding Algorithm education ? ?Discharge ?WIC Program: Yes ? ?Consult Status ?Consult Status: Follow-up ?Date: 03/31/21 ?Follow-up type: In-patient ? ? ? ?Wendolyn Raso  Nicholson-Springer ?03/30/2021, 1:24 PM ? ? ? ?

## 2021-03-30 NOTE — Progress Notes (Signed)
POSTPARTUM PROGRESS NOTE ? ?PPD #1 ? ?Subjective: ? ?Zareth Glennice Bleacher is a 21 y.o. G2P1011 s/p NSVD at [redacted]w[redacted]d. Today she notes no acute complaints or concerns. She denies any problems with ambulating, voiding or po intake. Denies nausea or vomiting. She has no flatus, no BM.  Pain is well controlled.  Lochia minimal ?Denies fever/chills/chest pain/SOB.  no HA, no blurry vision, no RUQ pain ? ?Objective: ?Blood pressure 113/73, pulse 72, temperature 98.7 ?F (37.1 ?C), temperature source Oral, resp. rate 17, height 5\' 1"  (1.549 m), weight 77.1 kg, SpO2 100 %, unknown if currently breastfeeding. ? ?Physical Exam:  ?General: alert, cooperative and no distress ?Chest: no respiratory distress ?Heart: regular rate and rhythm ?Abdomen: soft, nontender ?Uterine Fundus: firm, appropriately tender ?Incision: n/a ?DVT Evaluation: No calf swelling or tenderness ?Extremities: no edema ?Skin: warm, dry ? ?Results for orders placed or performed during the hospital encounter of 03/27/21 (from the past 24 hour(s))  ?CBC     Status: Abnormal  ? Collection Time: 03/29/21 12:40 PM  ?Result Value Ref Range  ? WBC 16.3 (H) 4.0 - 10.5 K/uL  ? RBC 4.30 3.87 - 5.11 MIL/uL  ? Hemoglobin 11.7 (L) 12.0 - 15.0 g/dL  ? HCT 34.8 (L) 36.0 - 46.0 %  ? MCV 80.9 80.0 - 100.0 fL  ? MCH 27.2 26.0 - 34.0 pg  ? MCHC 33.6 30.0 - 36.0 g/dL  ? RDW 14.0 11.5 - 15.5 %  ? Platelets 168 150 - 400 K/uL  ? nRBC 0.0 0.0 - 0.2 %  ?Comprehensive metabolic panel     Status: Abnormal  ? Collection Time: 03/29/21 12:40 PM  ?Result Value Ref Range  ? Sodium 131 (L) 135 - 145 mmol/L  ? Potassium 3.7 3.5 - 5.1 mmol/L  ? Chloride 98 98 - 111 mmol/L  ? CO2 22 22 - 32 mmol/L  ? Glucose, Bld 105 (H) 70 - 99 mg/dL  ? BUN <5 (L) 6 - 20 mg/dL  ? Creatinine, Ser 0.65 0.44 - 1.00 mg/dL  ? Calcium 6.7 (L) 8.9 - 10.3 mg/dL  ? Total Protein 6.1 (L) 6.5 - 8.1 g/dL  ? Albumin 2.3 (L) 3.5 - 5.0 g/dL  ? AST 40 15 - 41 U/L  ? ALT 44 0 - 44 U/L  ? Alkaline Phosphatase 195 (H) 38 - 126  U/L  ? Total Bilirubin 0.5 0.3 - 1.2 mg/dL  ? GFR, Estimated >60 >60 mL/min  ? Anion gap 11 5 - 15  ?Magnesium     Status: Abnormal  ? Collection Time: 03/29/21 12:40 PM  ?Result Value Ref Range  ? Magnesium 5.9 (H) 1.7 - 2.4 mg/dL  ?Glucose, capillary     Status: None  ? Collection Time: 03/30/21  5:37 AM  ?Result Value Ref Range  ? Glucose-Capillary 78 70 - 99 mg/dL  ? ? ?Assessment/Plan: ?Yuxi Panetti is a 21 y.o. G2P1011 s/p NSVD at [redacted]w[redacted]d PPD#1 complicated by: ?1) preeclampsia with severe features ?-currently on Mag, plan to discontinue this afternoon ?-BP stable, may consider medication s/p Mag ? ?2) postpartum care ?-pain well controlled ?-no acute concerns ? ?Contraception: Depot ?Feeding: breast/bottle ? ?Dispo: Continue postpartum care as outlined above.  Likely discharge home tomorrow ? ? LOS: 3 days  ? ?Janyth Pupa, DO ?Faculty Attending, [facility] [employer] [facility] ?03/30/2021, 9:39 AM  ?

## 2021-03-30 NOTE — Anesthesia Postprocedure Evaluation (Signed)
Anesthesia Post Note ? ?Patient: Ruth Wood ? ?Procedure(s) Performed: AN AD HOC LABOR EPIDURAL ? ?  ? ?Patient location during evaluation: Mother Baby ?Anesthesia Type: Epidural ?Level of consciousness: awake and alert ?Pain management: pain level controlled ?Vital Signs Assessment: post-procedure vital signs reviewed and stable ?Respiratory status: spontaneous breathing, nonlabored ventilation and respiratory function stable ?Cardiovascular status: stable ?Postop Assessment: no headache, no backache, epidural receding, no apparent nausea or vomiting, patient able to bend at knees, adequate PO intake and able to ambulate ?Anesthetic complications: no ? ? ?No notable events documented. ? ?Last Vitals:  ?Vitals:  ? 03/30/21 0620 03/30/21 1638  ?BP:  113/73  ?Pulse:  72  ?Resp: 17 17  ?Temp:  37.1 ?C  ?SpO2:  100%  ?  ?Last Pain:  ?Vitals:  ? 03/30/21 0822  ?TempSrc: Oral  ?PainSc:   ? ?Pain Goal:   ? ?  ?  ?  ?  ?  ?  ?  ? ?Harlee Pursifull Hristova ? ? ? ? ?

## 2021-03-31 ENCOUNTER — Other Ambulatory Visit (HOSPITAL_COMMUNITY): Payer: Self-pay

## 2021-03-31 DIAGNOSIS — H9190 Unspecified hearing loss, unspecified ear: Secondary | ICD-10-CM

## 2021-03-31 DIAGNOSIS — Z789 Other specified health status: Secondary | ICD-10-CM | POA: Diagnosis present

## 2021-03-31 MED ORDER — IBUPROFEN 600 MG PO TABS: 600.0000 mg | ORAL_TABLET | Freq: Four times a day (QID) | ORAL | 0 refills | Status: DC | PRN

## 2021-03-31 MED ORDER — NIFEDIPINE ER 30 MG PO TB24
30.0000 mg | ORAL_TABLET | Freq: Every day | ORAL | 0 refills | Status: AC
Start: 2021-04-01 — End: ?
  Filled 2021-03-31: qty 42, 42d supply, fill #0

## 2021-03-31 MED ORDER — ACETAMINOPHEN 325 MG PO TABS: 650.0000 mg | ORAL_TABLET | ORAL | Status: DC | PRN

## 2021-03-31 NOTE — Lactation Note (Signed)
This note was copied from a baby's chart. ?Lactation Consultation Note ? ?Patient Name: Ruth Wood ?Today's Date: 03/31/2021 ?Reason for consult: Follow-up assessment;Early term 37-38.6wks;Infant < 6lbs;Other (Comment) (LC went to visit mom and she was in the shower . LC will F/U prior to D/C. spoke with the [facility] RN caring for her earlier and per RN per mom , plans to buy a DEBP .) ?Age:20 hours ? ?Maternal Data ?  ? ?Feeding ?Mother's Current Feeding Choice: Breast Milk and Formula ? ?LATCH Score ?  ? ?  ? ?  ? ?  ? ?  ? ?  ? ? ?Lactation Tools Discussed/Used ?  ? ?Interventions ?  ? ?Discharge ?  ? ?Consult Status ?Consult Status: Follow-up ?Date: 03/31/21 ?Follow-up type: In-patient ? ? ? ?Matilde Sprang Cleaster Shiffer ?03/31/2021, 11:35 AM ? ? ? ?

## 2021-03-31 NOTE — Progress Notes (Addendum)
Discharged home with significant other and infant. Ambulatory to car with Merlene Morse, NT.  Bracelets matched. ?

## 2021-03-31 NOTE — Clinical Social Work Maternal (Addendum)
?CLINICAL SOCIAL WORK MATERNAL/CHILD NOTE ? ?Patient Details  ?Name: Ruth Wood ?MRN: [id number] ?Date of Birth: Jan 19, 2001 ? ?Date:  03/31/2021 ? ?Clinical Social Worker Initiating Note:  Nurse, learning disability Date/Time: Initiated:  03/31/21/1358    ? ?Child's Name:  Ruth Wood  ? ?Biological Parents:  Mother, Father (FOB Ruth Wood is FOB and he is 20 yrs old [phone])  ? ?Need for Interpreter:  None  ? ?Reason for Referral:  Late or No Prenatal Care    ? ?Address:  [address] ?[place] [address]  ?  ?Phone number:  [phone] (home)    ? ?Additional phone number: FOB's number is [phone] ? ?Household Members/Support Persons (HM/SP):    (MOB reported that she resides with her older 2 brothers.) ? ? ?HM/SP Name Relationship DOB or Age  ?HM/SP -1        ?HM/SP -2        ?HM/SP -3        ?HM/SP -4        ?HM/SP -5        ?HM/SP -6        ?HM/SP -7        ?HM/SP -8        ? ? ?Natural Supports (not living in the home):  Extended Family, Immediate Family, Spouse/significant other  ? ?Professional Supports: None  ? ?Employment: Unemployed  ? ?Type of Work:    ? ?Education:  9 to 11 years (MOB reported that she completed 10th grade befor quitting school.)  ? ?Homebound arranged: No ? ?Financial Resources:  Medicaid  ? ?Other Resources:  WIC (CSW provided MOB with  information to apply for Food Stamps.)  ? ?Cultural/Religious Considerations Which May Impact Care:  Per MOB's Chart, MOB is [place]. ? ?Strengths:  Ability to meet basic needs  , Pediatrician chosen, Home prepared for child    ? ?Psychotropic Medications:        ? ?Pediatrician:    [facility] ? ?Pediatrician List:  ? ?[place]    ?[place]    ?[facility]    ?[place]  ?[facility]    ?[facility]    ? ? ?Pediatrician Fax Number:   ? ?Risk Factors/Current Problems:  None  ? ?Cognitive State:  Able to Concentrate  , Insightful  , Alert  , Linear Thinking    ? ?Mood/Affect:   Comfortable  , Interested  , Happy  , Relaxed    ? ?CSW Assessment: CSW met with MOB in room 105 to complete an assessment for a consult for Limited PNC.  When CSW arrived, MOB was resting in bed and infant was being attended to by [facility] NP. MOB was inviting, polite, and engaging.  CSW inquired about MOB's Limited PNC and MOB stated, ?I didn't's have any insurance and I just don't know why I didn't go to the doctor."  CSW assessed MOB for barriers for attending follow-up appointments for infant and MOB.  MOB denied any barriers, and reported that she has reliable transportation. MOB also reported that she has a good support team that consists of FOB and her two older brothers.   Per, her Mother will be a support when she return back to [place] in a few weeks.  MOB communicated that MOB feels prepared to care for infant post discharge and shared that she has all essential items. CSW inquired about car seat and a safe place for the infant to sleep.  MOB  stated that she has a used car seat that is not expired and a bassinet. CSW reviewed hospital's policy regarding Limited PNC and MOB was understanding. MOB denied having any questions or concerns and she also denied the use of all illicit substances. CSW informed MOB that CSW will continue to monitor the infant's CDS and will make a report to [place] if warranted. MOB understood the hospital's policy and procedures and did not have any questions or concerns.  ?   ? ?CSW provided education regarding the baby blues period vs. perinatal mood disorders, discussed treatment and gave resources for mental health follow up if concerns arise.  CSW recommends self-evaluation during the postpartum time period and encouraged MOB to contact a medical professional if symptoms are noted at any time. CSW assessed for safety and MOB denied SI, HI, and DV.  ? ?CSW expressed concerns about MOB's hearing.  MOB shared that she has a follow-up appointment with her Dr. On  March 13th.  ? ?CSW provided Sudden Infant Death Syndrome (SIDS) education. MOB appropriately answered MOB's questions and asked appropriate questions.  ?CSW identifies no further need for intervention and no barriers to discharge at this time. ? ?CSW utilized interpreting service to assist with language barrier Ruth Wood [phone]) ? ?CSW Plan/Description:  No Further Intervention Required/No Barriers to Discharge, Sudden Infant Death Syndrome (SIDS) Education, Perinatal Mood and Anxiety Disorder (PMADs) Education, Other Patient/Family Education, Other Information/Referral to [place], CSW Will Continue to Monitor Umbilical Cord Tissue Drug Screen Results and Make Report if Warranted  ? ?Laurey Arrow, MSW, LCSW ?Clinical Social Work ?[phone] ? ?Martinique Pizzimenti D BOYD-GILYARD, LCSW ?03/31/2021, 2:13 PM ? ?

## 2021-03-31 NOTE — Lactation Note (Addendum)
This note was copied from a baby's chart. ?Lactation Consultation Note ? ?Patient Name: Ruth Wood ?Today's Date: 03/31/2021 ? Wallene Huh Spanish translator present during visit.  ?Age: 20 hrs ?LC went to assist with breastfeeding as provider, Barnetta Chapel, want to see a latch prior to discharge. Infant had a recent feeding. Mom to call for latch assistance for next feeding, RN Dossie Arbour aware.  ?Nettie Elm served as Engineer, structural for second visit.  ?LC returned infant latched on both breasts with signs of milk transfer.  ?Infant then supplemented with formula.  ? ?LPTI guidelines reviewed.  ?Mom feeding 8-12x 24 hr period.  ?Mom to offer breasts and look for signs of milk transfer.  ?Mom to supplement with EBM first followed by formula 20 to 30 ml per feeding.  ?Mom to follow up with [facility] for further [facility] support.  ?Infant pace bottle fed 32 ml at the end of the visit.  ?Mom aware to keep total feeding under 30 min.  ? ?Mom using manual pump for now q 3hrs for 10 min each breast. Mom will also try to get electric pump on her own if not able to get one from [facility].  ?All questions answered at the end of the visit.  ? ?Maternal Data ?  ? ?Feeding ?  ? ?LATCH Score ?  ? ?  ? ?  ? ?  ? ?  ? ?  ? ? ?Lactation Tools Discussed/Used ?  ? ?Interventions ?  ? ?Discharge ?  ? ?Consult Status ?  ? ? ? ?Atom Solivan  Nicholson-Springer ?03/31/2021, 5:41 PM ? ? ? ?

## 2021-03-31 NOTE — Lactation Note (Signed)
This note was copied from a baby's chart. ?Lactation Consultation Note ? ?Patient Name: Ruth Wood ?Today's Date: 03/31/2021 ?Reason for consult: Follow-up assessment;Early term 37-38.6wks;Infant < 6lbs;Infant weight loss;Other (Comment) (5 % weight loss , LC called the [facility] Spanish interpreter and was instructed to use the online service that she was detained. Gabriel Rainwater #[id number]) ?Age:20 hours ?Mom feeding baby has LC entered the room.  ?LC reviewed BF D/C teaching for Et , less than 6 pound infant.  ?LC plan of care :  ?Feed with feeding cues by 3 hours if the baby is showing feeding cues , wake the baby up around the clock.  ?Offer the breast 1st for 16 -20 mins , then supplement 30 ml and increase gradually.  ?After feeding post pump both breast for 15 -20 mins , save milk for the next feeding.  ?The next feeding due the same on the other breast / supplement / post pump.  ?Per mom plans to call [facility] for a DEBP and LC sent a referral today.  ?Mom aware of the [facility] resources after D/C. Has the numbers.  ? ?Maternal Data ?Has patient been taught Hand Expression?:  (per mom feels comfortable with hand pump expressing and feeding her baby at the breast) ? ?Feeding ?Mother's Current Feeding Choice: Breast Milk and Formula ?Nipple Type: Slow - flow ? ?LATCH Score ?  ? ?  ? ?  ? ?  ? ?  ? ?  ? ? ?Lactation Tools Discussed/Used ?Tools: Pump;Flanges ?Flange Size: 24 ?Breast pump type: Manual;Double-Electric Breast Pump ?Pump Education: Milk Storage;Other (comment) (mom was able to tell the [facility] the storage guidelines) ?Reason for Pumping: per mom has noted pumped since yesterday ? ?Interventions ?Interventions: Breast feeding basics reviewed;Education;LC Services brochure ? ?Discharge ?Discharge Education: Engorgement and breast care;Warning signs for feeding baby ?Pump: Manual ?WIC Program: Yes (per mom has the [facility] number, and [facility] it at her side and she plans to call [facility] . LC also will send a  referral) ? ?Consult Status ?Consult Status: Complete ?Date: 03/31/21 ?Follow-up type: In-patient ? ? ? ?Ruth Wood ?03/31/2021, 1:29 PM ? ? ? ?

## 2021-04-03 ENCOUNTER — Telehealth: Payer: Self-pay | Admitting: Obstetrics & Gynecology

## 2021-04-03 NOTE — Telephone Encounter (Signed)
Pt refused appt for today or tomorrow. Agreed to an appointment on Monday.  ?

## 2021-04-03 NOTE — Telephone Encounter (Signed)
Ruth Wood the follow up home nurse calling with report for pt blood pressure was 128/70 using manual cuff and 129/81 using auto mec. Also states that patients has 3 out of 30; no temp no other concerns besides patient states that she has pain when peeing, frequent trips to pee with little to no discard of urine. Pt refused patient for today and tomorrow states will come on Monday 04/07/21.  ?

## 2021-04-07 ENCOUNTER — Encounter: Payer: Self-pay | Admitting: *Deleted

## 2021-04-07 ENCOUNTER — Ambulatory Visit (INDEPENDENT_AMBULATORY_CARE_PROVIDER_SITE_OTHER): Payer: Self-pay | Admitting: *Deleted

## 2021-04-07 ENCOUNTER — Other Ambulatory Visit: Payer: Self-pay

## 2021-04-07 VITALS — BP 126/69 | HR 85 | Ht 62.0 in | Wt 163.5 lb

## 2021-04-07 DIAGNOSIS — Z0130 Encounter for examination of blood pressure without abnormal findings: Secondary | ICD-10-CM

## 2021-04-07 NOTE — Progress Notes (Signed)
? ?  NURSE VISIT- BLOOD PRESSURE CHECK ? ?SUBJECTIVE:  ?Ruth Wood is a 21 y.o. G78P1011 female here for BP check. She is postpartum, delivery date 03/29/21.    ? ?HYPERTENSION ROS: ? ?Pregnant/postpartum:  ?Severe headaches that don't go away with tylenol/other medicines: No  ?Visual changes (seeing spots/double/blurred vision) No  ?Severe pain under right breast breast or in center of upper chest No  ?Severe nausea/vomiting No  ?Taking medicines as instructed yes ? ? ?OBJECTIVE:  ?BP 126/69 (BP Location: Left Arm, Patient Position: Sitting, Cuff Size: Normal)   Pulse 85   Ht 5\' 2"  (1.575 m)   Wt 163 lb 8 oz (74.2 kg)   Breastfeeding Yes   BMI 29.90 kg/m?   ?Appearance alert, well appearing, and in no distress. ? ?ASSESSMENT: ?Postpartum  blood pressure check ? ?PLAN: ?Discussed with , CNM, WHNP   ?Recommendations: stop medicine 2 days before next visit. If pt feels dizzy or bad, check BP. If <100 or <60, can stop med.   ?Follow-up: as scheduled  ? ?Joellyn Haff  ?04/07/2021 ?10:54 AM ? ?

## 2021-05-06 ENCOUNTER — Ambulatory Visit: Payer: Self-pay | Admitting: Women's Health

## 2021-06-12 IMAGING — US US OB < 14 WEEKS - US OB TV
1 series · 15 of 28 positions shown · non-contrast
Comparison: None.

CLINICAL DATA: Vaginal bleeding

EXAM:
OBSTETRIC <14 WK US AND TRANSVAGINAL OB US
TECHNIQUE: Both transabdominal and transvaginal ultrasound examinations were
performed for complete evaluation of the gestation as well as the
maternal uterus, adnexal regions, and pelvic cul-de-sac.
Transvaginal technique was performed to assess early pregnancy.

[Series 1: us ob < 14 weeks - us ob tv · 15 of 84 slices shown]
[im 1/84]
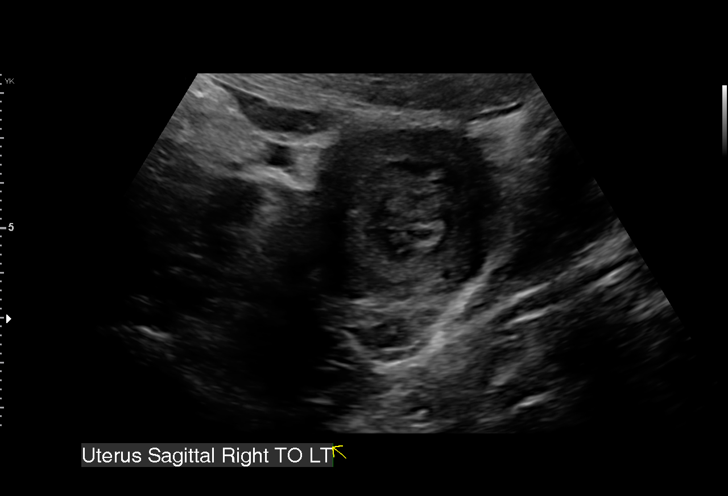
[im 7/84]
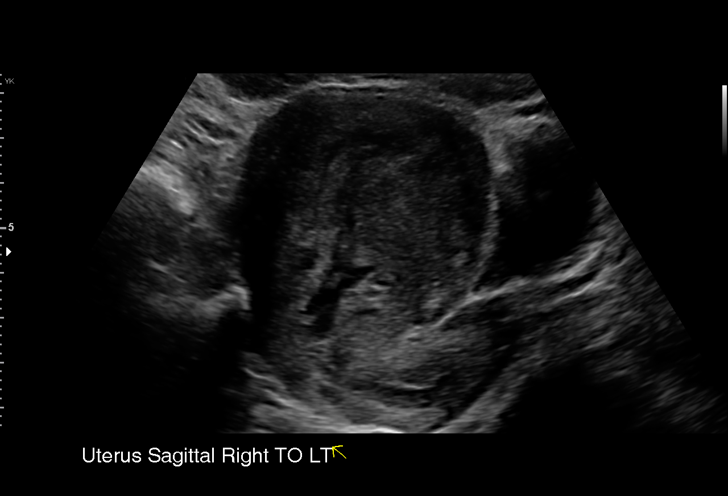
[im 13/84]
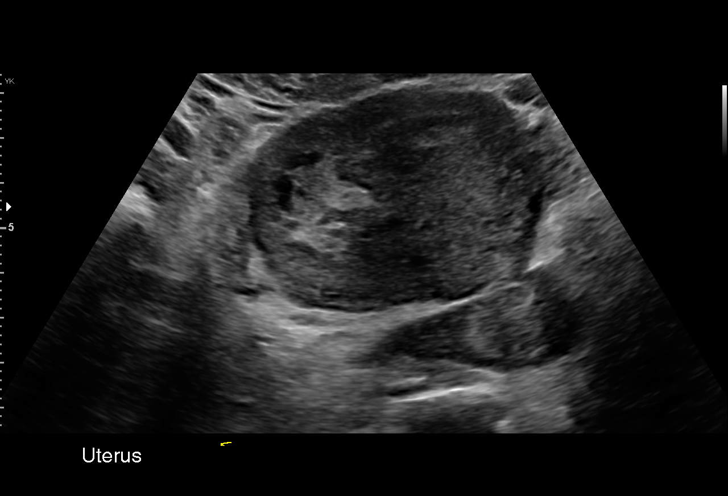
[im 19/84]
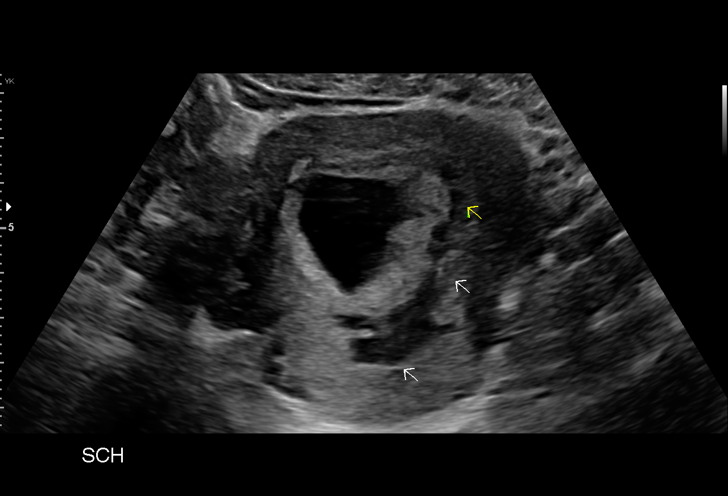
[im 25/84]
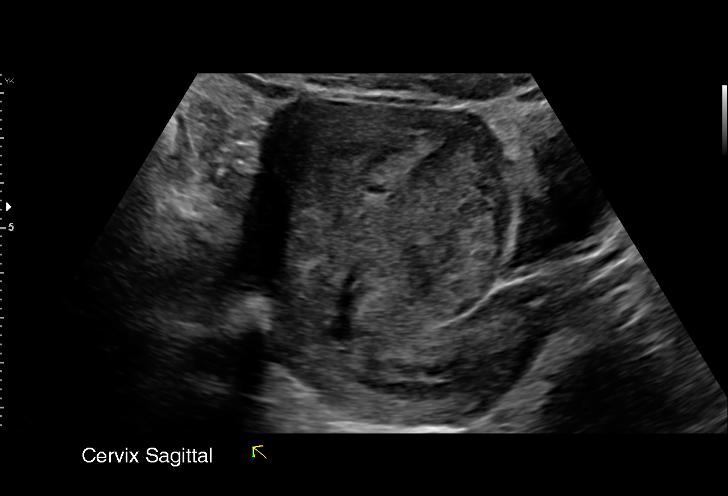
[im 31/84]
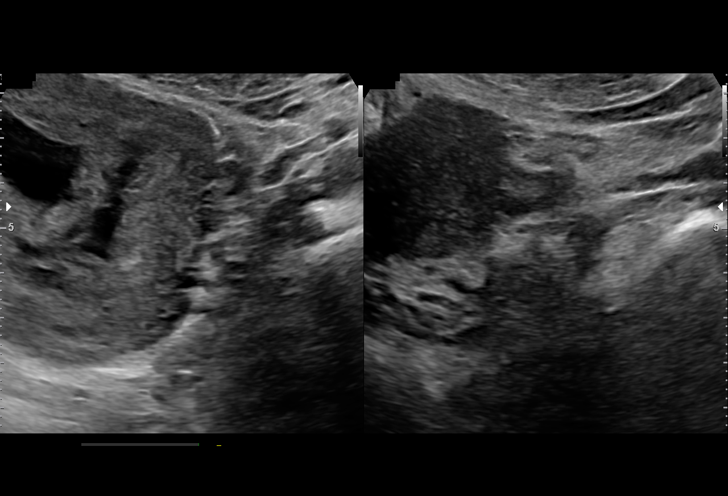
[im 37/84]
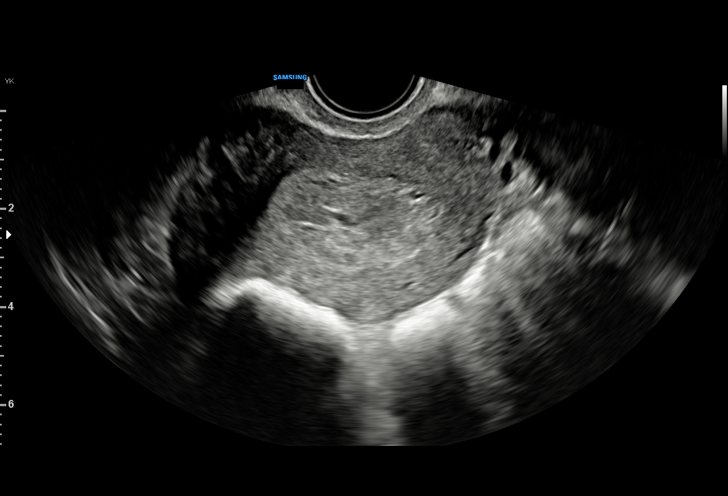
[im 44/84]
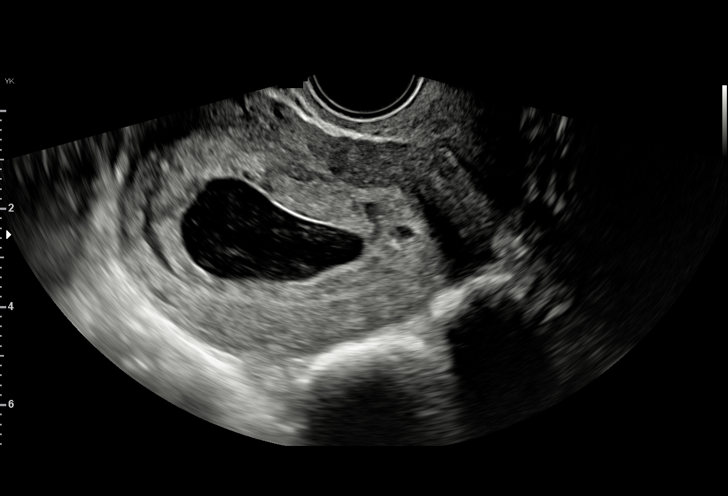
[im 47/84]
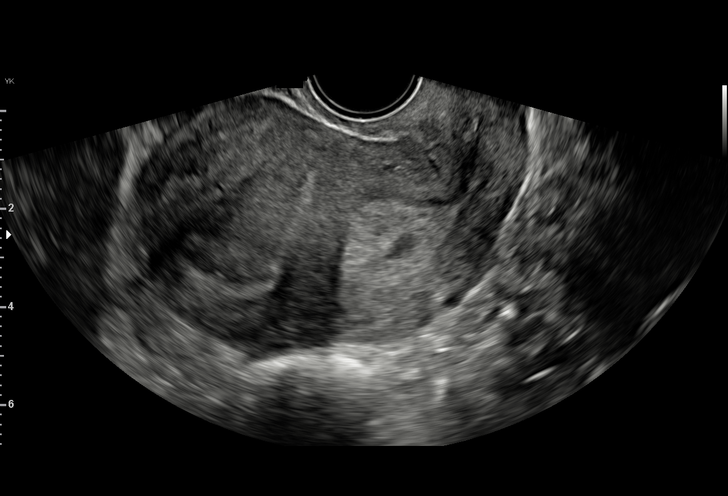
[im 53/84]
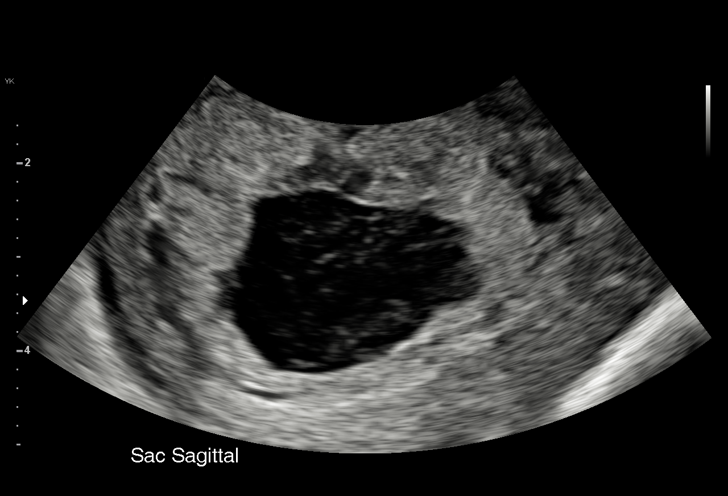
[im 59/84]
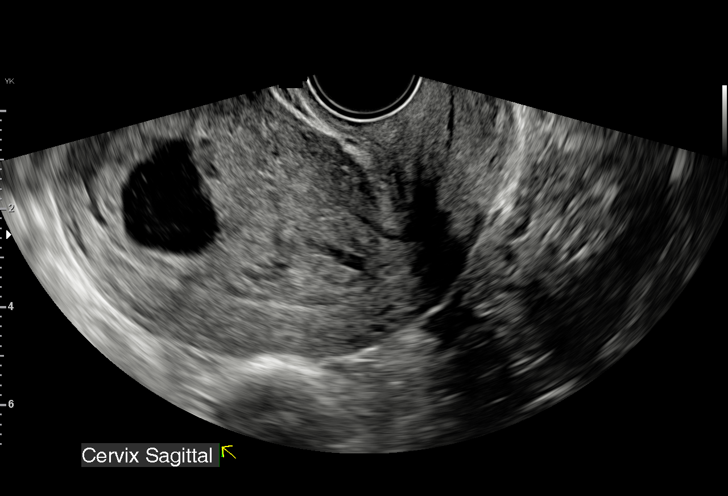
[im 65/84]
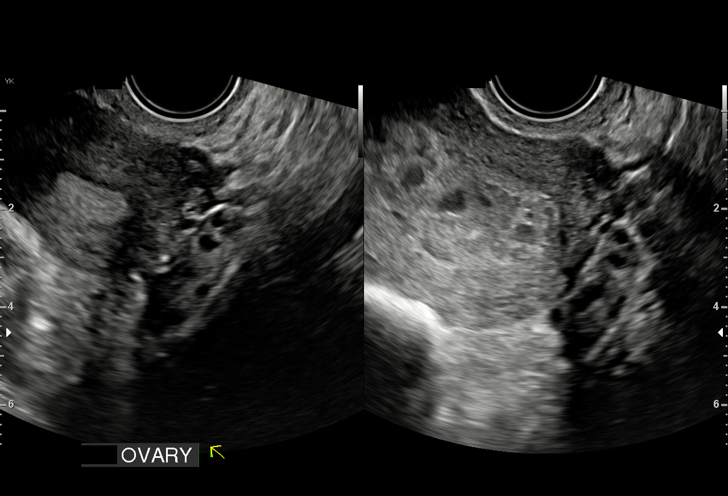
[im 71/84]
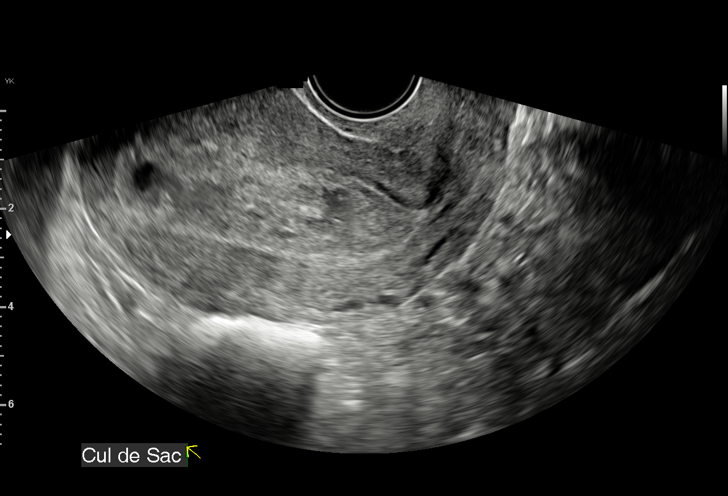
[im 77/84]
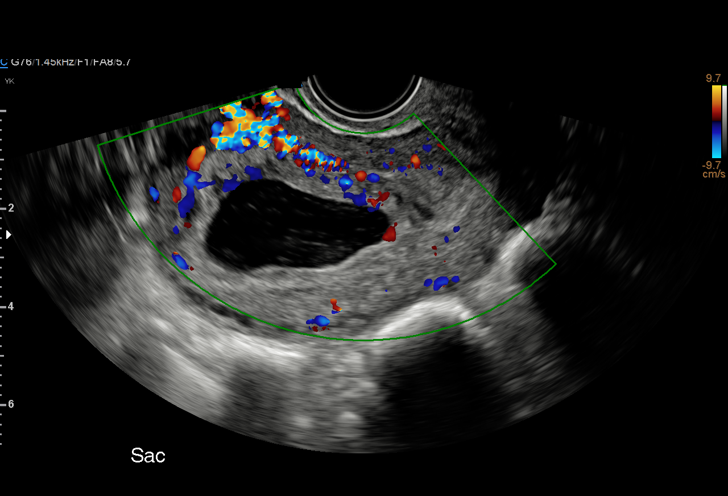
[im 84/84]
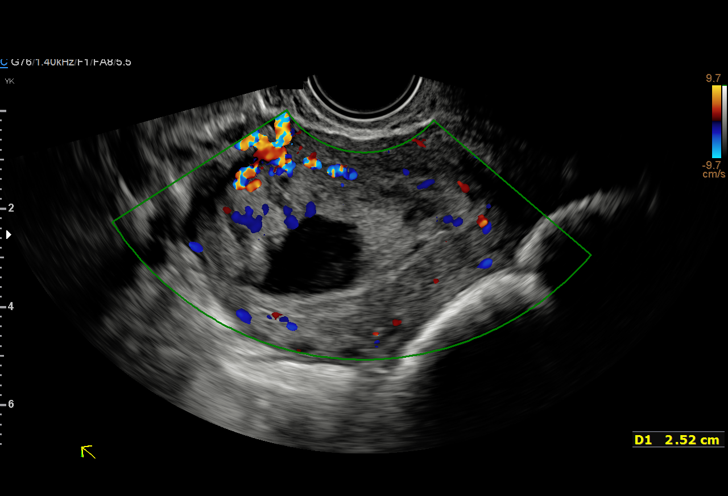

[15 of 28 positions shown; findings below may reference images not displayed]

FINDINGS: Intrauterine gestational sac: Single. Low level internal echoes
within the gestational sac.

Yolk sac:  Not Visualized.

Embryo:  Not Visualized.

Cardiac Activity: Not Visualized.

MSD: 22 mm   7 w   0 d

Subchorionic hemorrhage:  Moderate-sized.

Maternal uterus/adnexae: Right ovary measures 2.2 x 1.6 x 1.2 cm and
appears unremarkable. The left ovary measures 3.0 x 1.2 x 1.2 cm and
appears unremarkable. No free fluid within the pelvis.
IMPRESSION: 1. Intrauterine gestational sac with MSD of 22 mm containing
low-level internal echoes without visible yolk sac or embryo.
Findings are suspicious but not yet definitive for failed pregnancy.
Recommend follow-up US in 10-14 days for definitive diagnosis. This
recommendation follows SRU consensus guidelines: Diagnostic Criteria
for Nonviable Pregnancy Early in the First Trimester. N Engl J Med
2. Moderate-sized subchorionic hemorrhage.
